# Patient Record
Sex: Female | Born: 1970 | Race: White | Hispanic: No | Marital: Married | Smoking: Never smoker
Health system: Southern US, Community
[De-identification: ages and names within clinical notes are randomized; demographics above are authoritative.]

## PROBLEM LIST (undated history)

## (undated) DIAGNOSIS — H04129 Dry eye syndrome of unspecified lacrimal gland: Secondary | ICD-10-CM

## (undated) DIAGNOSIS — E039 Hypothyroidism, unspecified: Secondary | ICD-10-CM

## (undated) HISTORY — PX: HYSTERECTOMY ABDOMINAL WITH SALPINGECTOMY: SHX6725

## (undated) HISTORY — PX: BILE DUCT PERCUTANEOUS DILATION: SHX1226

## (undated) HISTORY — PX: TONSILLECTOMY: SUR1361

## (undated) HISTORY — PX: CHOLECYSTECTOMY: SHX55

## (undated) HISTORY — PX: LITHOTRIPSY: SUR834

---

## 2020-03-07 ENCOUNTER — Emergency Department (INDEPENDENT_AMBULATORY_CARE_PROVIDER_SITE_OTHER)
Admission: RE | Admit: 2020-03-07 | Discharge: 2020-03-07 | Disposition: A | Discharge status: Home / Self Care | Payer: BC Managed Care – PPO | Source: Ambulatory Visit

## 2020-03-07 ENCOUNTER — Emergency Department (INDEPENDENT_AMBULATORY_CARE_PROVIDER_SITE_OTHER): Payer: BC Managed Care – PPO

## 2020-03-07 ENCOUNTER — Other Ambulatory Visit: Payer: Self-pay

## 2020-03-07 VITALS — BP 121/84 | HR 132 | Temp 98.1°F | Resp 14 | Ht 61.0 in | Wt 130.0 lb

## 2020-03-07 DIAGNOSIS — J90 Pleural effusion, not elsewhere classified: Secondary | ICD-10-CM

## 2020-03-07 DIAGNOSIS — J918 Pleural effusion in other conditions classified elsewhere: Secondary | ICD-10-CM | POA: Diagnosis not present

## 2020-03-07 DIAGNOSIS — R059 Cough, unspecified: Secondary | ICD-10-CM | POA: Diagnosis not present

## 2020-03-07 HISTORY — DX: Dry eye syndrome of unspecified lacrimal gland: H04.129

## 2020-03-07 HISTORY — DX: Hypothyroidism, unspecified: E03.9

## 2020-03-07 NOTE — ED Provider Notes (Signed)
[facility]    CSN: [id number] Arrival date & time: 03/07/20  1154      History   Chief Complaint Chief Complaint  Patient presents with  . Cough    HPI Mackenzie Morton is a 49 y.o. female.   Initial visit for this 49 year old woman.  Patient presents to Urgent Care with complaints of persistent cough on and off for months. Pt also has recent hx of hospitalizations including gall bladder removal with complications that required a PCT (for drainage) placed 4 years ago. Reason for this visit is the persistent cough. Pt is vaccinated for both covid and flu.  Patient been running a fever lately.       Past Medical History:  Diagnosis Date  . Dry eye   . Hypothyroidism     There are no problems to display for this patient.   Past Surgical History:  Procedure Laterality Date  . BILE DUCT PERCUTANEOUS DILATION    . CHOLECYSTECTOMY    . HYSTERECTOMY ABDOMINAL WITH SALPINGECTOMY    . LITHOTRIPSY    . TONSILLECTOMY      OB History    Gravida  4   Para      Term      Preterm      AB  1   Living  3     SAB      IAB      Ectopic      Multiple      Live Births               Home Medications    Prior to Admission medications   Medication Sig Start Date End Date Taking? Authorizing Provider  levothyroxine (SYNTHROID) 50 MCG tablet Alternate with 75 mcg every other day. 12/22/19  Yes [provider]  pantoprazole (PROTONIX) 40 MG tablet Take 1 tablet by mouth daily. 12/22/19  Yes [provider]  sodium chloride flush 0.9 % SOLN injection  02/20/20  Yes [provider]  cycloSPORINE, PF, (CEQUA) 0.09 % SOLN Place 1 drop into both eyes 2 times daily.    [provider]  SYNTHROID 75 MCG tablet Take 75 mcg by mouth daily. 12/30/19   [provider]    Family History Family History  Problem Relation Age of Onset  . Breast cancer Mother   . Hypertension Mother   . Breast cancer Father   .  Heart failure Father     Social History Social History   Tobacco Use  . Smoking status: Never Smoker  . Smokeless tobacco: Never Used  Vaping Use  . Vaping Use: Never used  Substance Use Topics  . Alcohol use: Never  . Drug use: Never     Allergies   Cephalosporins and Ciprofloxacin   Review of Systems Review of Systems  Constitutional: Positive for fever.  HENT: Positive for congestion.   Respiratory: Positive for cough.      Physical Exam Triage Vital Signs ED Triage Vitals  Enc Vitals Group     BP 03/07/20 1218 121/84     Pulse Rate 03/07/20 1218 (S) (!) 132     Resp 03/07/20 1218 14     Temp 03/07/20 1218 98.1 F (36.7 C)     Temp Source 03/07/20 1218 Oral     SpO2 03/07/20 1218 97 %     Weight 03/07/20 1211 130 lb (59 kg)     Height 03/07/20 1211 5\' 1"  (1.549 m)     Head Circumference --  Peak Flow --      Pain Score 03/07/20 1211 0     Pain Loc --      Pain Edu? --      Excl. in GC? --    No data found.  Updated Vital Signs BP 121/84 (BP Location: Left Arm)   Pulse (S) (!) 132 Comment: gets anxious with MD visits  Temp 98.1 F (36.7 C) (Oral)   Resp 14   Ht 5\' 1"  (1.549 m)   Wt 59 kg   SpO2 97%   BMI 24.56 kg/m    Physical Exam Vitals and nursing note reviewed.  Constitutional:      Appearance: Normal appearance. She is normal weight.  HENT:     Right Ear: Tympanic membrane normal.     Left Ear: Tympanic membrane normal.     Nose: Nose normal.     Mouth/Throat:     Mouth: Mucous membranes are moist.     Pharynx: Oropharynx is clear.  Eyes:     Conjunctiva/sclera: Conjunctivae normal.  Cardiovascular:     Rate and Rhythm: Normal rate.     Heart sounds: Normal heart sounds.  Pulmonary:     Effort: Pulmonary effort is normal.     Comments: Rales on inspiration anteriorly in the right chest, decreased breath sounds in the right base Musculoskeletal:        General: Normal range of motion.     Cervical back: Normal range of  motion and neck supple.  Skin:    General: Skin is warm and dry.  Neurological:     General: No focal deficit present.     Mental Status: She is alert and oriented to person, place, and time.  Psychiatric:        Mood and Affect: Mood normal.        Behavior: Behavior normal.        Thought Content: Thought content normal.      UC Treatments / Results  Labs (all labs ordered are listed, but only abnormal results are displayed) Labs Reviewed - No data to display  EKG   Radiology DG Chest 2 View  Result Date: 03/07/2020 CLINICAL DATA:  Cough, surgically draining bile duct EXAM: CHEST - 2 VIEW COMPARISON:  None. FINDINGS: Large right pleural effusion with associated trace nondependent gas. Left lung is clear. No pneumothorax. Associated eventration of the right hemidiaphragm with possible sub pulmonic effusion. The heart is normal in size. Cholecystectomy clips and surgical drain in the right upper abdomen. IMPRESSION: Large right pleural effusion with associated trace nondependent gas. Given the clinical history, this may be related to the patient's known bile leak. Consider thoracentesis for fluid testing. These results will be called to the ordering clinician or representative by the Radiologist Assistant, and communication documented in the PACS or 03/09/2020. Electronically Signed   By: [employer] M.D.   On: 03/07/2020 13:08    Procedures Procedures (including critical care time)  Medications Ordered in UC Medications - No data to display  Initial Impression / Assessment and Plan / UC Course  I have reviewed the triage vital signs and the nursing notes.  Pertinent labs & imaging results that were available during my care of the patient were reviewed by me and considered in my medical decision making (see chart for details).     Final Clinical Impressions(s) / UC Diagnoses   Final diagnoses:  Cough     Discharge Instructions     You have a  large right  pulmonary effusion that needs to be drained.  If you haven't heard back from your doctor by 3 pm, go to the emergency department    ED Prescriptions    None     PDMP not reviewed this encounter.   Elvina Sidle, MD 03/07/20 1325

## 2020-03-07 NOTE — ED Triage Notes (Signed)
Patient presents to Urgent Care with complaints of persistent cough on and off for months. Pt also has recent hx of hospitalizations including gall bladder removal with complications that required a PCT (for drainage) placed. Reason for this visit is the persistent cough. Pt is vaccinated for both covid and flu.

## 2020-03-07 NOTE — Discharge Instructions (Addendum)
You have a large right pleural effusion that needs to be drained.  If you haven't heard back from your doctor by 3 pm, go to the emergency department

## 2022-06-26 IMAGING — DX DG CHEST 2V
2 series · 2 of 2 positions shown · non-contrast
Comparison: None.

CLINICAL DATA: Cough, surgically draining bile duct

EXAM:
CHEST - 2 VIEW

[chest pa]
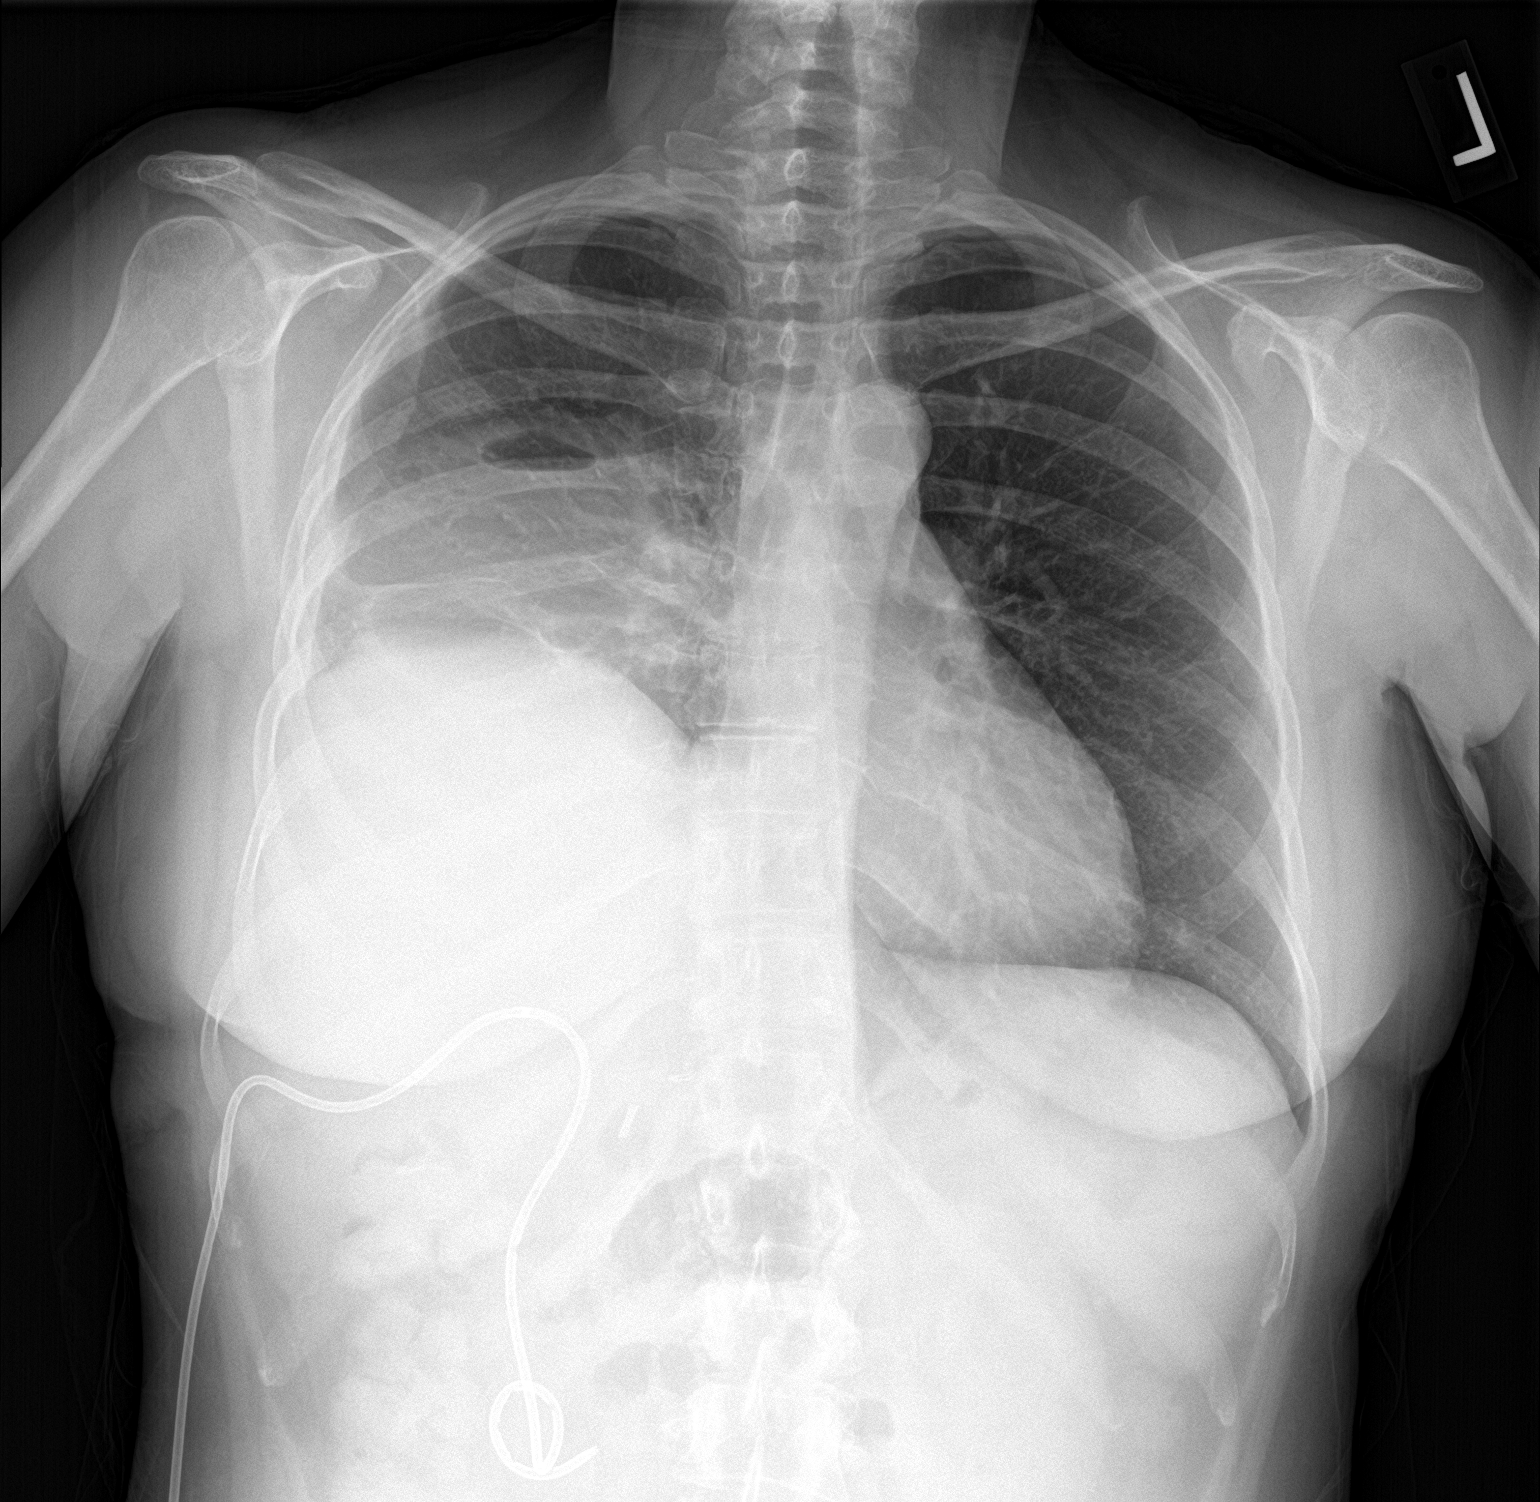

[chest lat]
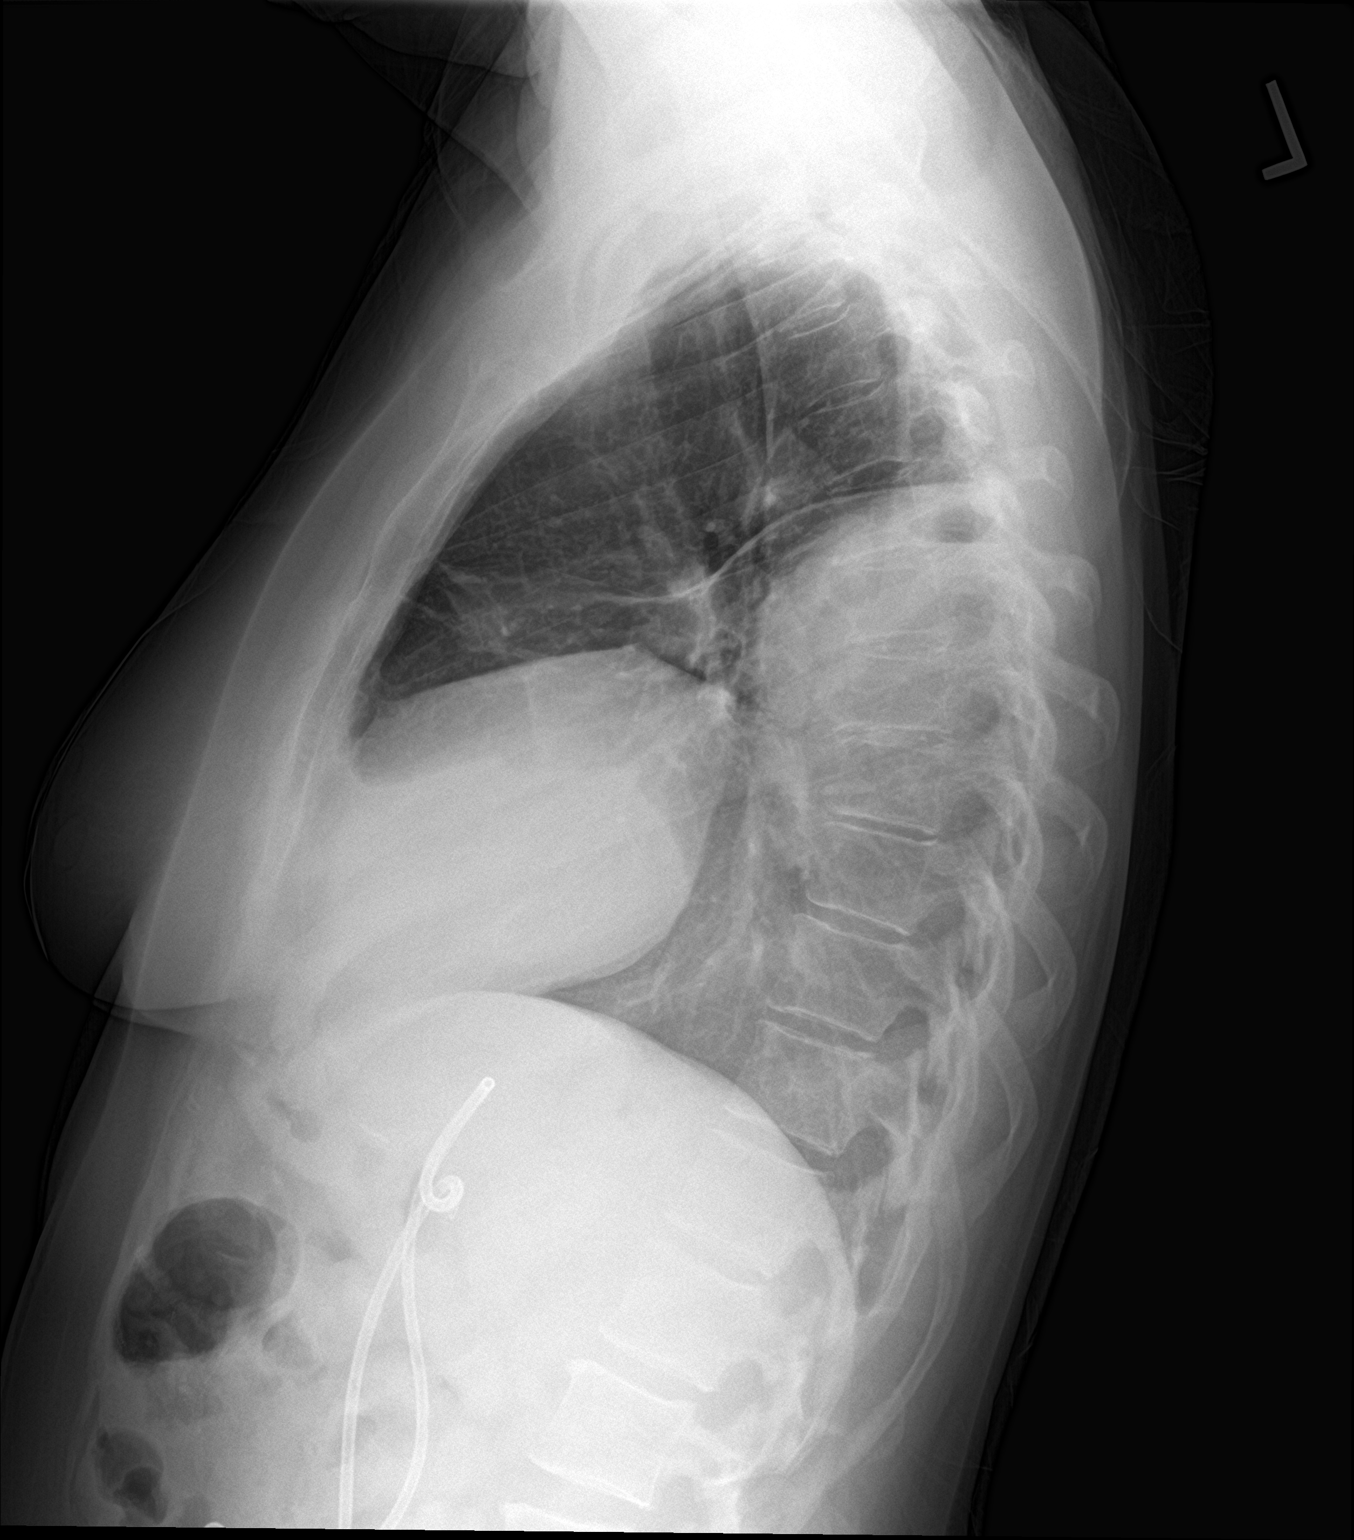

[2 of 2 positions shown; findings below may reference images not displayed]

FINDINGS: Large right pleural effusion with associated trace nondependent gas.
Left lung is clear. No pneumothorax.

Associated eventration of the right hemidiaphragm with possible sub
pulmonic effusion.

The heart is normal in size.

Cholecystectomy clips and surgical drain in the right upper abdomen.
IMPRESSION: Large right pleural effusion with associated trace nondependent gas.
Given the clinical history, this may be related to the patient's
known bile leak. Consider thoracentesis for fluid testing.

These results will be called to the ordering clinician or
representative by the Radiologist Assistant, and communication
documented in the PACS or [REDACTED].
